# Patient Record
Sex: Male | Born: 1985 | Race: Black or African American | Hispanic: No | Marital: Single
Health system: Southern US, Community
[De-identification: ages and names within clinical notes are randomized; demographics above are authoritative.]

---

## 2017-03-14 ENCOUNTER — Emergency Department (HOSPITAL_COMMUNITY): Payer: BLUE CROSS/BLUE SHIELD

## 2017-03-14 ENCOUNTER — Encounter (HOSPITAL_COMMUNITY): Payer: Self-pay | Admitting: Emergency Medicine

## 2017-03-14 ENCOUNTER — Emergency Department (HOSPITAL_COMMUNITY)
Admission: EM | Admit: 2017-03-14 | Discharge: 2017-03-14 | Disposition: A | Discharge status: Home / Self Care | Payer: BLUE CROSS/BLUE SHIELD | Attending: Emergency Medicine | Admitting: Emergency Medicine

## 2017-03-14 DIAGNOSIS — Y939 Activity, unspecified: Secondary | ICD-10-CM | POA: Insufficient documentation

## 2017-03-14 DIAGNOSIS — S0990XA Unspecified injury of head, initial encounter: Secondary | ICD-10-CM | POA: Diagnosis not present

## 2017-03-14 DIAGNOSIS — S5012XA Contusion of left forearm, initial encounter: Secondary | ICD-10-CM | POA: Diagnosis not present

## 2017-03-14 DIAGNOSIS — S40022A Contusion of left upper arm, initial encounter: Secondary | ICD-10-CM

## 2017-03-14 DIAGNOSIS — Y92410 Unspecified street and highway as the place of occurrence of the external cause: Secondary | ICD-10-CM | POA: Diagnosis not present

## 2017-03-14 DIAGNOSIS — S069X9A Unspecified intracranial injury with loss of consciousness of unspecified duration, initial encounter: Secondary | ICD-10-CM

## 2017-03-14 DIAGNOSIS — Y998 Other external cause status: Secondary | ICD-10-CM | POA: Insufficient documentation

## 2017-03-14 DIAGNOSIS — Z23 Encounter for immunization: Secondary | ICD-10-CM | POA: Insufficient documentation

## 2017-03-14 DIAGNOSIS — T148XXA Other injury of unspecified body region, initial encounter: Secondary | ICD-10-CM

## 2017-03-14 MED ORDER — TETANUS-DIPHTH-ACELL PERTUSSIS 5-2.5-18.5 LF-MCG/0.5 IM SUSP
0.5000 mL | Freq: Once | INTRAMUSCULAR | Status: AC
  Administered 2017-03-14: 0.5 mL via INTRAMUSCULAR
  Filled 2017-03-14: qty 0.5

## 2017-03-14 MED ORDER — IBUPROFEN 600 MG PO TABS: 600.0000 mg | ORAL_TABLET | Freq: Four times a day (QID) | ORAL | 0 refills | Status: DC | PRN

## 2017-03-14 MED ORDER — OXYCODONE-ACETAMINOPHEN 5-325 MG PO TABS
2.0000 | ORAL_TABLET | Freq: Once | ORAL | Status: AC
  Administered 2017-03-14: 2 via ORAL
  Filled 2017-03-14: qty 2

## 2017-03-14 MED ORDER — HYDROCODONE-ACETAMINOPHEN 5-325 MG PO TABS: 2.0000 | ORAL_TABLET | ORAL | 0 refills | Status: AC | PRN
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
## 2017-03-14 NOTE — Discharge Instructions (Signed)
Tonight your x-rays are all within normal parameters, i.e. no fractures, no intracranial bleeding. You do have contusions to your arm and ribs.  These will be sore for the next several days to weeks You have been given prescription for pain control as well as anti-inflammatory, please take these as directed. Return anytime you develop worsening symptoms
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
## 2017-03-19 ENCOUNTER — Emergency Department (HOSPITAL_COMMUNITY)
Admission: EM | Admit: 2017-03-19 | Discharge: 2017-03-19 | Disposition: A | Discharge status: Home / Self Care | Payer: BLUE CROSS/BLUE SHIELD | Attending: Emergency Medicine | Admitting: Emergency Medicine

## 2017-03-19 ENCOUNTER — Encounter (HOSPITAL_COMMUNITY): Payer: Self-pay | Admitting: Vascular Surgery

## 2017-03-19 ENCOUNTER — Emergency Department (HOSPITAL_COMMUNITY): Payer: BLUE CROSS/BLUE SHIELD

## 2017-03-19 DIAGNOSIS — M545 Low back pain, unspecified: Secondary | ICD-10-CM

## 2017-03-19 DIAGNOSIS — R51 Headache: Secondary | ICD-10-CM | POA: Diagnosis not present

## 2017-03-19 MED ORDER — METHOCARBAMOL 500 MG PO TABS: 500.0000 mg | ORAL_TABLET | Freq: Two times a day (BID) | ORAL | 0 refills | Status: AC

## 2017-03-19 MED ORDER — IBUPROFEN 600 MG PO TABS: 600.0000 mg | ORAL_TABLET | Freq: Four times a day (QID) | ORAL | 0 refills | Status: AC | PRN

## 2017-03-19 MED ORDER — IBUPROFEN 800 MG PO TABS
800.0000 mg | ORAL_TABLET | Freq: Once | ORAL | Status: AC
  Administered 2017-03-19: 800 mg via ORAL
  Filled 2017-03-19: qty 1
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
## 2017-03-19 NOTE — Discharge Instructions (Signed)
Take ibuprofen and Robaxin as directed. Return to ED for worsening pain, additional injury, numbness, weakness, inability to walk or loss of bladder function.
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
## 2019-07-28 ENCOUNTER — Other Ambulatory Visit: Payer: Self-pay

## 2019-07-28 DIAGNOSIS — Z20822 Contact with and (suspected) exposure to covid-19: Secondary | ICD-10-CM

## 2019-07-29 ENCOUNTER — Telehealth: Payer: Self-pay | Admitting: General Practice

## 2019-07-29 LAB — NOVEL CORONAVIRUS, NAA: SARS-CoV-2, NAA: NOT DETECTED

## 2019-07-29 NOTE — Telephone Encounter (Signed)
Negative COVID results given. Patient results "NOT Detected." Caller expressed understanding. ° °

## 2024-04-06 ENCOUNTER — Ambulatory Visit
Admission: RE | Admit: 2024-04-06 | Discharge: 2024-04-06 | Disposition: A | Discharge status: Home / Self Care | Source: Ambulatory Visit | Attending: Nurse Practitioner | Admitting: Nurse Practitioner

## 2024-04-06 ENCOUNTER — Other Ambulatory Visit: Payer: Self-pay | Admitting: Nurse Practitioner

## 2024-04-06 DIAGNOSIS — Z021 Encounter for pre-employment examination: Secondary | ICD-10-CM
